# Patient Record
Sex: Male | Born: 1985 | Race: Black or African American | Hispanic: No | Marital: Single | Smoking: Never smoker
Health system: Southern US, Community
[De-identification: ages and names within clinical notes are randomized; demographics above are authoritative.]

## PROBLEM LIST (undated history)

## (undated) DIAGNOSIS — H548 Legal blindness, as defined in USA: Secondary | ICD-10-CM

## (undated) DIAGNOSIS — G4730 Sleep apnea, unspecified: Secondary | ICD-10-CM

## (undated) DIAGNOSIS — R519 Headache, unspecified: Secondary | ICD-10-CM

## (undated) HISTORY — PX: TONSILLECTOMY: SUR1361

## (undated) HISTORY — DX: Headache, unspecified: R51.9

## (undated) HISTORY — PX: EYE SURGERY: SHX253

---

## 2016-10-05 ENCOUNTER — Emergency Department
Admission: EM | Admit: 2016-10-05 | Discharge: 2016-10-05 | Disposition: A | Discharge status: Home / Self Care | Payer: Self-pay | Attending: Emergency Medicine | Admitting: Emergency Medicine

## 2016-10-05 ENCOUNTER — Encounter: Payer: Self-pay | Admitting: Emergency Medicine

## 2016-10-05 DIAGNOSIS — R0789 Other chest pain: Secondary | ICD-10-CM | POA: Insufficient documentation

## 2016-10-05 HISTORY — DX: Sleep apnea, unspecified: G47.30

## 2016-10-05 MED ORDER — MELOXICAM 15 MG PO TABS: 15.0000 mg | ORAL_TABLET | Freq: Every day | ORAL | 0 refills | Status: DC

## 2016-10-05 MED ORDER — PREDNISONE 50 MG PO TABS: 50.0000 mg | ORAL_TABLET | Freq: Every day | ORAL | 0 refills | Status: DC

## 2016-10-05 NOTE — ED Provider Notes (Signed)
[facility] Emergency Department Provider Note  ____________________________________________  Time seen: Approximately 3:10 PM  I have reviewed the triage vital signs and the nursing notes.   HISTORY  Chief Complaint Chest Pain    HPI Kyle Bradley is a 31 y.o. male who presents to emergency department complaining of anterior chest wall pain. Patient reports that symptoms have been intermittent over the past several months. Patient reports that he has a significant history of sleep apnea and is supposed to be on CPAP and states that his machine is not ordered at this time. She reports that he wakes up and feels "sore" to the restroom. Patient reports that he can take Motrin and symptoms go away. Patient reports her as a tight/sore sensation to the anterior chest wall. It is reproducible with movement and palpation. Patient denies any cardiac history. He denies any productive cough, lower extremity edema. Patient is currently symptom-free. He denies any headaches, visual changes, shortness of breath, abdominal pain, nausea or vomiting.   Past Medical History:  Diagnosis Date  . Sleep apnea     There are no active problems to display for this patient.   History reviewed. No pertinent surgical history.  Prior to Admission medications   Medication Sig Start Date End Date Taking? Authorizing Provider  meloxicam (MOBIC) 15 MG tablet Take 1 tablet (15 mg total) by mouth daily. 10/05/16   Delorise Royals Cuthriell, PA-C  predniSONE (DELTASONE) 50 MG tablet Take 1 tablet (50 mg total) by mouth daily with breakfast. 10/05/16   Delorise Royals Cuthriell, PA-C    Allergies Patient has no allergy information on record.  History reviewed. No pertinent family history.  Social History Social History  Substance Use Topics  . Smoking status: Never Smoker  . Smokeless tobacco: Never Used  . Alcohol use No     Review of Systems  Constitutional: No fever/chills Eyes: No  visual changes. No discharge ENT: No upper respiratory complaints. Cardiovascular:Positive chest pain. Respiratory: no cough. No SOB. Gastrointestinal: No abdominal pain.  No nausea, no vomiting.  No diarrhea.  No constipation. Musculoskeletal: Negative for musculoskeletal pain. Skin: Negative for rash, abrasions, lacerations, ecchymosis. Neurological: Negative for headaches, focal weakness or numbness. 10-point ROS otherwise negative.  ____________________________________________   PHYSICAL EXAM:  VITAL SIGNS: ED Triage Vitals  Enc Vitals Group     BP 10/05/16 1429 126/72     Pulse Rate 10/05/16 1429 91     Resp 10/05/16 1429 18     Temp 10/05/16 1429 99.3 F (37.4 C)     Temp Source 10/05/16 1429 Oral     SpO2 10/05/16 1429 98 %     Weight 10/05/16 1423 215 lb (97.5 kg)     Height 10/05/16 1423  (1.778 m)     Head Circumference --      Peak Flow --      Pain Score 10/05/16 1422 0     Pain Loc --      Pain Edu? --      Excl. in GC? --      Constitutional: Alert and oriented. Well appearing and in no acute distress. Eyes: Conjunctivae are normal. PERRL. EOMI. Head: Atraumatic. ENT:      Ears:       Nose: No congestion/rhinnorhea.      Mouth/Throat: Mucous membranes are moist.  Neck: No stridor.    Cardiovascular: Normal rate, regular rhythm. Normal S1 and S2. No murmurs rubs or gallops. Good peripheral circulation. Respiratory: Normal respiratory  effort without tachypnea or retractions. Lungs CTAB. Good air entry to the bases with no decreased or absent breath sounds. Musculoskeletal: Full range of motion to all extremities. No gross deformities appreciated. No visible deformity to chest wall but inspection. Patient is tender to palpation bilateral sternal border. Palpation reproduces pain. No palpable abnormality. Neurologic:  Normal speech and language. No gross focal neurologic deficits are appreciated.  Skin:  Skin is warm, dry and intact. No rash  noted. Psychiatric: Mood and affect are normal. Speech and behavior are normal. Patient exhibits appropriate insight and judgement.   ____________________________________________   LABS (all labs ordered are listed, but only abnormal results are displayed)  Labs Reviewed - No data to display ____________________________________________  EKG  EKG reveals normal sinus rhythm at a rate of 92 bpm. No ST elevations or depressions noted. Nonspecific T-wave changes in lateral leads. PR, QRS, QT interval within normal limits. No Q waves or delta waves. ____________________________________________  RADIOLOGY   No results found.  ____________________________________________    PROCEDURES  Procedure(s) performed:    Procedures    Medications - No data to display   ____________________________________________   INITIAL IMPRESSION / ASSESSMENT AND PLAN / ED COURSE  Pertinent labs & imaging results that were available during my care of the patient were reviewed by me and considered in my medical decision making (see chart for details).  Review of the [place] CSRS was performed in accordance of the [facility] prior to dispensing any controlled drugs.     Patient's diagnosis is consistent with chest wall pain. At this time, no concern for cardiac involvement as pain is reproducible with palpation and easily calmed with ibuprofen. Patient is tender to palpation along the intercostal margin of sternal border. EKG reveals no significant changes. At this time, no imaging or labs again necessary.. Patient will be discharged home with prescriptions for anti-inflammatory and limited steroids. Patient is informed to follow-up with primary care to become established as a patient as well as to clean his CPAP machine so he can uses at nighttime.. Patient is given ED precautions to return to the ED for any worsening or new symptoms.     ____________________________________________  FINAL CLINICAL  IMPRESSION(S) / ED DIAGNOSES  Final diagnoses:  Chest wall pain      NEW MEDICATIONS STARTED DURING THIS VISIT:  New Prescriptions   MELOXICAM (MOBIC) 15 MG TABLET    Take 1 tablet (15 mg total) by mouth daily.   PREDNISONE (DELTASONE) 50 MG TABLET    Take 1 tablet (50 mg total) by mouth daily with breakfast.        This chart was dictated using voice recognition software/Dragon. Despite best efforts to proofread, errors can occur which can change the meaning. Any change was purely unintentional.    Racheal Patches, PA-C 10/05/16 1536    Rockne Menghini, MD 10/05/16 1749

## 2016-10-05 NOTE — ED Triage Notes (Signed)
Pt states has chest pain in the am since he stopped using his sleep apnea machine. States he needs to clean it but wants to get a special machine to sterilize it. Has no family doctor since moving here 2 months ago. No current chest pain. States the pain only last a few minutes on awaking

## 2016-10-05 NOTE — ED Notes (Signed)

## 2016-11-05 ENCOUNTER — Encounter (HOSPITAL_COMMUNITY): Payer: Self-pay

## 2016-11-05 ENCOUNTER — Emergency Department (HOSPITAL_COMMUNITY): Payer: Self-pay

## 2016-11-05 ENCOUNTER — Emergency Department (HOSPITAL_COMMUNITY)
Admission: EM | Admit: 2016-11-05 | Discharge: 2016-11-05 | Disposition: A | Discharge status: Home / Self Care | Payer: Self-pay | Attending: Emergency Medicine | Admitting: Emergency Medicine

## 2016-11-05 DIAGNOSIS — Y999 Unspecified external cause status: Secondary | ICD-10-CM | POA: Insufficient documentation

## 2016-11-05 DIAGNOSIS — S61412A Laceration without foreign body of left hand, initial encounter: Secondary | ICD-10-CM

## 2016-11-05 DIAGNOSIS — Y939 Activity, unspecified: Secondary | ICD-10-CM | POA: Insufficient documentation

## 2016-11-05 DIAGNOSIS — S61215A Laceration without foreign body of left ring finger without damage to nail, initial encounter: Secondary | ICD-10-CM | POA: Insufficient documentation

## 2016-11-05 DIAGNOSIS — Y929 Unspecified place or not applicable: Secondary | ICD-10-CM | POA: Insufficient documentation

## 2016-11-05 HISTORY — DX: Legal blindness, as defined in USA: H54.8

## 2016-11-05 MED ORDER — BACITRACIN ZINC 500 UNIT/GM EX OINT
TOPICAL_OINTMENT | Freq: Two times a day (BID) | CUTANEOUS | Status: DC
  Administered 2016-11-05: 1 via TOPICAL

## 2016-11-05 MED ORDER — LIDOCAINE HCL 2 % IJ SOLN
20.0000 mL | Freq: Once | INTRAMUSCULAR | Status: AC
  Administered 2016-11-05: 400 mg
  Filled 2016-11-05: qty 20

## 2016-11-05 NOTE — ED Notes (Signed)
Bed: [facility] Expected date:  Expected time:  Means of arrival:  Comments: 

## 2016-11-05 NOTE — Discharge Instructions (Signed)
Please read attached information. If you experience any new or worsening signs or symptoms please return to the emergency room for evaluation. Please follow-up with your primary care provider or specialist as discussed.  °

## 2016-11-05 NOTE — ED Notes (Signed)
Bed: [facility]7 Expected date:  Expected time:  Means of arrival:  Comments: 

## 2016-11-05 NOTE — ED Notes (Signed)
Bed: [facility]4 Expected date:  Expected time:  Means of arrival:  Comments: 
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
## 2019-01-04 ENCOUNTER — Emergency Department (HOSPITAL_COMMUNITY)
Admission: EM | Admit: 2019-01-04 | Discharge: 2019-01-04 | Disposition: A | Discharge status: Home / Self Care | Payer: No Typology Code available for payment source | Attending: Emergency Medicine | Admitting: Emergency Medicine

## 2019-01-04 ENCOUNTER — Encounter (HOSPITAL_COMMUNITY): Payer: Self-pay

## 2019-01-04 ENCOUNTER — Other Ambulatory Visit: Payer: Self-pay

## 2019-01-04 ENCOUNTER — Emergency Department (HOSPITAL_COMMUNITY): Payer: No Typology Code available for payment source

## 2019-01-04 DIAGNOSIS — M791 Myalgia, unspecified site: Secondary | ICD-10-CM | POA: Insufficient documentation

## 2019-01-04 DIAGNOSIS — Y92410 Unspecified street and highway as the place of occurrence of the external cause: Secondary | ICD-10-CM | POA: Diagnosis not present

## 2019-01-04 DIAGNOSIS — M5441 Lumbago with sciatica, right side: Secondary | ICD-10-CM

## 2019-01-04 DIAGNOSIS — R51 Headache: Secondary | ICD-10-CM | POA: Insufficient documentation

## 2019-01-04 DIAGNOSIS — Z79899 Other long term (current) drug therapy: Secondary | ICD-10-CM | POA: Insufficient documentation

## 2019-01-04 DIAGNOSIS — M542 Cervicalgia: Secondary | ICD-10-CM | POA: Diagnosis not present

## 2019-01-04 DIAGNOSIS — R200 Anesthesia of skin: Secondary | ICD-10-CM | POA: Diagnosis not present

## 2019-01-04 DIAGNOSIS — M5442 Lumbago with sciatica, left side: Secondary | ICD-10-CM | POA: Diagnosis not present

## 2019-01-04 DIAGNOSIS — R0789 Other chest pain: Secondary | ICD-10-CM | POA: Insufficient documentation

## 2019-01-04 DIAGNOSIS — Y999 Unspecified external cause status: Secondary | ICD-10-CM | POA: Insufficient documentation

## 2019-01-04 DIAGNOSIS — R55 Syncope and collapse: Secondary | ICD-10-CM | POA: Insufficient documentation

## 2019-01-04 DIAGNOSIS — Y9389 Activity, other specified: Secondary | ICD-10-CM | POA: Diagnosis not present

## 2019-01-04 MED ORDER — HYDROCODONE-ACETAMINOPHEN 5-325 MG PO TABS: 1.0000 | ORAL_TABLET | Freq: Four times a day (QID) | ORAL | 0 refills | Status: DC | PRN

## 2019-01-04 MED ORDER — METHOCARBAMOL 500 MG PO TABS: 500.0000 mg | ORAL_TABLET | Freq: Two times a day (BID) | ORAL | 0 refills | Status: DC

## 2019-01-04 MED ORDER — IBUPROFEN 400 MG PO TABS
600.0000 mg | ORAL_TABLET | Freq: Once | ORAL | Status: AC
  Administered 2019-01-04: 600 mg via ORAL
  Filled 2019-01-04: qty 1
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
## 2019-01-04 NOTE — ED Notes (Signed)
Patient verbalizes understanding of discharge instructions. Opportunity for questioning and answering were provided.  patient discharged from ED.  

## 2019-01-10 ENCOUNTER — Emergency Department (HOSPITAL_COMMUNITY)
Admission: EM | Admit: 2019-01-10 | Discharge: 2019-01-10 | Disposition: A | Discharge status: Home / Self Care | Payer: No Typology Code available for payment source | Attending: Emergency Medicine | Admitting: Emergency Medicine

## 2019-01-10 ENCOUNTER — Encounter (HOSPITAL_COMMUNITY): Payer: Self-pay | Admitting: Emergency Medicine

## 2019-01-10 DIAGNOSIS — S39012D Strain of muscle, fascia and tendon of lower back, subsequent encounter: Secondary | ICD-10-CM

## 2019-01-10 DIAGNOSIS — M545 Low back pain: Secondary | ICD-10-CM | POA: Diagnosis present

## 2019-01-10 DIAGNOSIS — G44309 Post-traumatic headache, unspecified, not intractable: Secondary | ICD-10-CM | POA: Diagnosis not present

## 2019-01-10 DIAGNOSIS — M542 Cervicalgia: Secondary | ICD-10-CM | POA: Insufficient documentation

## 2019-01-10 LAB — I-STAT CHEM 8, ED
BUN: 8 mg/dL (ref 6–20)
Calcium, Ion: 1.14 mmol/L — ABNORMAL LOW (ref 1.15–1.40)
Chloride: 104 mmol/L (ref 98–111)
Creatinine, Ser: 1.1 mg/dL (ref 0.61–1.24)
Glucose, Bld: 83 mg/dL (ref 70–99)
HCT: 48 % (ref 39.0–52.0)
Hemoglobin: 16.3 g/dL (ref 13.0–17.0)
Potassium: 4.1 mmol/L (ref 3.5–5.1)
Sodium: 137 mmol/L (ref 135–145)
TCO2: 27 mmol/L (ref 22–32)

## 2019-01-10 MED ORDER — HYDROCODONE-ACETAMINOPHEN 5-325 MG PO TABS: 1.0000 | ORAL_TABLET | Freq: Once | ORAL | Status: DC

## 2019-01-10 MED ORDER — KETOROLAC TROMETHAMINE 30 MG/ML IJ SOLN
30.0000 mg | Freq: Once | INTRAMUSCULAR | Status: AC
  Administered 2019-01-10: 30 mg via INTRAVENOUS
  Filled 2019-01-10: qty 1

## 2019-01-10 MED ORDER — DEXAMETHASONE SODIUM PHOSPHATE 10 MG/ML IJ SOLN
10.0000 mg | Freq: Once | INTRAMUSCULAR | Status: AC
  Administered 2019-01-10: 10 mg via INTRAVENOUS
  Filled 2019-01-10: qty 1

## 2019-01-10 MED ORDER — SODIUM CHLORIDE 0.9 % IV BOLUS
1000.0000 mL | Freq: Once | INTRAVENOUS | Status: AC
  Administered 2019-01-10: 1000 mL via INTRAVENOUS

## 2019-01-10 NOTE — Discharge Instructions (Signed)
It was my pleasure taking care of you today!   Please call the primary care clinic listed to schedule a follow up appointment.   Return to ER for new or worsening symptoms, any additional concerns.
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
## 2019-01-13 ENCOUNTER — Ambulatory Visit (INDEPENDENT_AMBULATORY_CARE_PROVIDER_SITE_OTHER): Payer: Medicare Other | Admitting: Family Medicine

## 2019-01-13 ENCOUNTER — Encounter: Payer: Self-pay | Admitting: Family Medicine

## 2019-01-13 VITALS — Ht 70.0 in | Wt 235.0 lb

## 2019-01-13 DIAGNOSIS — M542 Cervicalgia: Secondary | ICD-10-CM | POA: Diagnosis not present

## 2019-01-13 DIAGNOSIS — M545 Low back pain, unspecified: Secondary | ICD-10-CM

## 2019-01-13 DIAGNOSIS — G44319 Acute post-traumatic headache, not intractable: Secondary | ICD-10-CM | POA: Diagnosis not present

## 2019-01-13 MED ORDER — CYCLOBENZAPRINE HCL 10 MG PO TABS: 10.0000 mg | ORAL_TABLET | Freq: Every day | ORAL | 0 refills | Status: DC

## 2019-01-13 MED ORDER — SUMATRIPTAN SUCCINATE 50 MG PO TABS: 50.0000 mg | ORAL_TABLET | ORAL | 0 refills | Status: DC | PRN

## 2019-01-13 MED ORDER — IBUPROFEN 800 MG PO TABS: 800.0000 mg | ORAL_TABLET | Freq: Three times a day (TID) | ORAL | 1 refills | Status: DC | PRN
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
## 2019-01-14 ENCOUNTER — Telehealth: Payer: Self-pay | Admitting: Family Medicine

## 2019-01-14 NOTE — Telephone Encounter (Signed)
Letter faxed and copy send to pt's address provided. Left detail message inform the pt.

## 2019-01-14 NOTE — Telephone Encounter (Signed)
Letter printed at my desk, waiting for the pt to call back with fax #.    Copied from [employer] [phone]. Topic: General - Other >> Jan 14, 2019  9:02 AM [name] wrote: Reason for CRM: Pt called to see if work letter can be faxed to his employer. Pt will be calling back with the fax number

## 2019-01-14 NOTE — Telephone Encounter (Signed)
Pt called back to provide fax number: [phone]  Pt states this needs to be faxed today. Please call and leave a VM when this is completed for his records. Requesting this to be mailed to him as well:  [address] [place] [address]

## 2019-01-20 ENCOUNTER — Inpatient Hospital Stay: Payer: Medicare Other

## 2019-01-22 ENCOUNTER — Ambulatory Visit: Payer: Medicare Other | Attending: Family Medicine | Admitting: Physical Therapy

## 2019-01-22 ENCOUNTER — Other Ambulatory Visit: Payer: Self-pay

## 2019-01-22 ENCOUNTER — Encounter: Payer: Self-pay | Admitting: Physical Therapy

## 2019-01-22 DIAGNOSIS — M545 Low back pain, unspecified: Secondary | ICD-10-CM

## 2019-01-22 DIAGNOSIS — G44319 Acute post-traumatic headache, not intractable: Secondary | ICD-10-CM
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
## 2019-01-27 ENCOUNTER — Ambulatory Visit: Payer: Medicare Other | Admitting: Physical Therapy

## 2019-01-28 ENCOUNTER — Encounter: Payer: Self-pay | Admitting: Physical Therapy

## 2019-01-28 ENCOUNTER — Other Ambulatory Visit: Payer: Self-pay

## 2019-01-28 ENCOUNTER — Ambulatory Visit: Payer: Medicare Other | Attending: Family Medicine | Admitting: Physical Therapy

## 2019-01-28 DIAGNOSIS — M545 Low back pain, unspecified: Secondary | ICD-10-CM

## 2019-01-28 DIAGNOSIS — G44319 Acute post-traumatic headache, not intractable: Secondary | ICD-10-CM | POA: Insufficient documentation

## 2019-01-28 NOTE — Therapy (Signed)
[place], [place], [address] Phone: [phone]   Fax:  [phone]  Physical Therapy Treatment  Patient Details  Name: Kyle Bradley MRN: [id number] Date of Birth: 06/09/1986 Referring Provider (PT): Letta Median, DO   Encounter Date: 01/28/2019  PT End of Session - 01/28/19 1553    Visit Number  2    Number of Visits  12    Date for PT Re-Evaluation  03/05/19    Authorization Type  Medicare, progress note by visit 10 and KX at visit 15    PT Start Time  1414    PT Stop Time  1512    PT Time Calculation (min)  58 min    Activity Tolerance  Patient limited by pain    Behavior During Therapy  [facility] for tasks assessed/performed       Past Medical History:  Diagnosis Date  . Legally blind   . Sleep apnea     Past Surgical History:  Procedure Laterality Date  . EYE SURGERY    . TONSILLECTOMY      There were no vitals filed for this visit.  Subjective Assessment - 01/28/19 1443    Subjective  Pain a 6/10 this PM for both neck and low back. Pt. reports he continues to have exacerbation of his symptoms with work duties for bending and lifting required. He continues also with intermittent "Charlie horse" pain and cramping in his lower legs.    Pertinent History  legally blind    Limitations  Sitting;House hold activities;Lifting;Standing;Walking    Diagnostic tests  CXR, CT scan    Currently in Pain?  Yes    Pain Score  6     Pain Location  Back    Pain Orientation  Right;Left;Lower    Pain Descriptors / Indicators  Sharp    Pain Type  Acute pain    Pain Radiating Towards  posterior legs, notes "charlie horses" in calves    Pain Onset  1 to 4 weeks ago    Pain Frequency  Constant    Aggravating Factors   bending, prolonged standing, bending and lifting    Pain Relieving Factors  lying down    Effect of Pain on Daily Activities  impacts ability for work duties, positional tolerance for ADLs     Multiple Pain Sites  Yes    Pain Location  Head    Pain Orientation  --   "whole head"   Pain Descriptors / Indicators  Aching    Pain Type  Acute pain    Pain Radiating Towards  neck and bilateral upper trapezius    Pain Onset  1 to 4 weeks ago    Pain Frequency  Constant    Aggravating Factors   no specific aggs noted    Pain Relieving Factors  cold, medication                       [facility] Adult PT Treatment/Exercise - 01/28/19 0001      Neck Exercises: Supine   Neck Retraction  15 reps    Neck Retraction Limitations  with gentle anual assistance      Lumbar Exercises: Stretches   Single Knee to Chest Stretch  Right;Left;3 reps;10 seconds    Pelvic Tilt  10 reps      Lumbar Exercises: Aerobic   Nustep  L3 x 5 min UE/LE      Modalities   Modalities  Electrical  Stimulation;Moist Heat      Moist Heat Therapy   Number Minutes Moist Heat  10 Minutes    Moist Heat Location  Cervical;Lumbar Spine      Electrical Stimulation   Electrical Stimulation Location  cervical and lumbar in supine    Electrical Stimulation Action  IFC    Electrical Stimulation Parameters  10 minutes    Electrical Stimulation Goals  Tone;Pain      Manual Therapy   Manual Therapy  Joint mobilization;Soft tissue mobilization;Myofascial release;Manual Traction    Joint Mobilization  supine grade V thoracic manipulation, long axis distraction hip mobilizations grade I-IV in supine with hip in slight IR, neutral position otherwise    Soft tissue mobilization  Cervical paraspinals, upper trapezius and levator region bilaterally    Myofascial Release  suboccipital release, upper trap and levator myofascial release incorporated with stretches    Manual Traction  gentle cervical manual traction      Neck Exercises: Stretches   Upper Trapezius Stretch  Right;Left;3 reps;20 seconds   supine manual stretches   Levator Stretch  Right;Left;3 reps;20 seconds   supine manual stretches   Corner  Stretch  3 reps;20 seconds             PT Education - 01/28/19 1551    Education Details  treatment logic for joint/spinal mobilization, POC, exercises    Person(s) Educated  Patient    Methods  Explanation    Comprehension  Verbalized understanding;Returned demonstration;Verbal cues required;Tactile cues required       PT Short Term Goals - 01/22/19 0921      PT SHORT TERM GOAL #1   Title  Independent with HEP    Baseline  needs HEP    Time  3    Period  Weeks    Status  New      PT SHORT TERM GOAL #2   Title  Increase bilat. shoulder flexion and abduction AROM 20 deg or greater to improve ability for reaching for dressing, chores at home such as reaching into overhead cabinets    Baseline  80 deg flexion and 70 deg abduction bilat.    Time  3    Period  Weeks      PT SHORT TERM GOAL #3   Title  Increase trunk flexion AROM 20 deg or greater to improve ability to bend forward to donn shoes    Baseline  20 deg    Time  3    Period  Weeks    Status  New        PT Long Term Goals - 01/22/19 [id number]      PT LONG TERM GOAL #1   Title  Improve FOTO outcome measure score to 34% or less impairment    Baseline  41% limited    Time  6    Period  Weeks    Status  New    Target Date  03/05/19      PT LONG TERM GOAL #2   Title  Bilat. UE/LE strength grossly 5/5 to improve ability for lifting activities for work    Baseline  4/5    Time  6    Period  Weeks    Status  New    Target Date  03/05/19      PT LONG TERM GOAL #3   Title  Increase cervical AROM for right rotation at least 20 deg to improve ability to turn head while driving    Baseline  38 deg    Time  6    Period  Weeks    Status  New    Target Date  03/05/19      PT LONG TERM GOAL #4   Title  Tolerate sitting and standing at least 30 min for improved positional tolerance for work duties, driving, eating meals with pain <4/10 at worst    Baseline  10/10    Time  6    Period  Weeks    Status  New     Target Date  03/05/19      PT LONG TERM GOAL #5   Title  Trunk and bilat. shoulder AROM grossly WFL for dressing, bathing    Baseline  see objective    Time  6    Period  Weeks    Status  New    Target Date  03/05/19            Plan - 01/28/19 1553    Clinical Impression Statement  Mild decrease in pain from baseline but still with diffuse muscle pain and spasm affecting lumbar and cervical region with associated headaches. Given symptom etiology/mechanism of injury expect progress will be gradual. Pt. would benefit from continued PT for further progress to help relieve pain and address associated functional limitations.    Personal Factors and Comorbidities  Comorbidity 1    Comorbidities  legally blind    Examination-Activity Limitations  Bathing;Dressing;Sit;Sleep;Hygiene/Grooming;Bed Mobility;Lift;Bend;Squat;Stand;Stairs;Reach Overhead;Carry;Locomotion Level    Examination-Participation Restrictions  Laundry;Shop;Cleaning;Community Activity;Yard Work;Meal Prep;Driving    Stability/Clinical Decision Making  Evolving/Moderate complexity    Clinical Decision Making  Moderate    Rehab Potential  Good    PT Frequency  2x / week    PT Duration  6 weeks    PT Treatment/Interventions  ADLs/Self Care Home Management;Ultrasound;Cryotherapy;Electrical Stimulation;Moist Heat;Traction;Therapeutic activities;Functional mobility training;Therapeutic exercise;Patient/family education;Neuromuscular re-education;Manual techniques;Passive range of motion;Taping;Spinal Manipulations;Dry needling    PT Next Visit Plan  plan more focus lumbar region next visit, continue gentle manual techniques, LAD for hips, lumbar STM, progress ROM and stretches as tolerated, modalities prn    PT Home Exercise Plan  cervical retractions, cervical rotation AROM, gentle upper trap stretch, scapular retractions, pelvic tilts, LTR, SKTC assisted with strap    Consulted and Agree with Plan of Care  Patient        Patient will benefit from skilled therapeutic intervention in order to improve the following deficits and impairments:  Pain, Postural dysfunction, Impaired UE functional use, Impaired flexibility, Decreased strength, Decreased activity tolerance, Decreased range of motion, Difficulty walking, Decreased mobility, Impaired tone, Increased muscle spasms, Hypomobility  Visit Diagnosis: 1. Acute bilateral low back pain without sciatica   2. Acute post-traumatic headache, not intractable        Problem List There are no active problems to display for this patient.   Lazarus Gowdahristopher Jaylyne Breese, PT, DPT 01/28/19 4:03 PM  [facility] [address] [place], [place], [address] Phone: [phone]   Fax:  [phone]  Name: Floy SabinaMarkel Groom MRN: [id number] Date of Birth: 10/21/1985

## 2019-01-29 ENCOUNTER — Ambulatory Visit: Payer: Medicare Other | Admitting: Physical Therapy

## 2019-01-29 ENCOUNTER — Other Ambulatory Visit: Payer: Self-pay | Admitting: Family Medicine

## 2019-01-29 DIAGNOSIS — M545 Low back pain, unspecified: Secondary | ICD-10-CM

## 2019-02-01 NOTE — Telephone Encounter (Signed)
Dr. Loletha Grayer please advise last OV 7/22 last refill 7/22 #15 0 refills

## 2019-02-02 ENCOUNTER — Other Ambulatory Visit: Payer: Self-pay

## 2019-02-02 ENCOUNTER — Encounter: Payer: Self-pay | Admitting: Physical Therapy

## 2019-02-02 ENCOUNTER — Ambulatory Visit: Payer: Medicare Other | Admitting: Physical Therapy

## 2019-02-02 DIAGNOSIS — M545 Low back pain, unspecified: Secondary | ICD-10-CM

## 2019-02-02 DIAGNOSIS — G44319 Acute post-traumatic headache, not intractable: Secondary | ICD-10-CM

## 2019-02-02 NOTE — Therapy (Signed)
[facility] [address] [place], [place], [address] Phone: [phone]   Fax:  [phone]  Physical Therapy Treatment  Patient Details  Name: Kyle Bradley MRN: [id number] Date of Birth: 01/24/1986 Referring Provider (PT): Luana ShuMary Cirigliano, DO   Encounter Date: 02/02/2019  PT End of Session - 02/02/19 1218    Visit Number  3    Number of Visits  12    Date for PT Re-Evaluation  03/05/19    Authorization Type  Medicare, progress note by visit 10 and KX at visit 15    PT Start Time  1216    PT Stop Time  1258    PT Time Calculation (min)  42 min    Activity Tolerance  Patient limited by pain    Behavior During Therapy  [facility] for tasks assessed/performed       Past Medical History:  Diagnosis Date  . Legally blind   . Sleep apnea     Past Surgical History:  Procedure Laterality Date  . EYE SURGERY    . TONSILLECTOMY      There were no vitals filed for this visit.  Subjective Assessment - 02/02/19 1217    Subjective  Same, neck and back about 5/10-6/10.  Continues to work at [employer].  Doubtful this will work with me going right to work each time.    Currently in Pain?  Yes    Pain Score  6     Pain Location  Back    Pain Orientation  Lower    Pain Descriptors / Indicators  Sore    Pain Type  Acute pain    Pain Onset  1 to 4 weeks ago    Pain Frequency  Constant    Aggravating Factors   bend, stand, lift, squat    Pain Relieving Factors  lying down    Pain Score  6    Pain Location  Neck    Pain Orientation  Posterior    Pain Descriptors / Indicators  Aching    Pain Type  Acute pain    Pain Onset  1 to 4 weeks ago    Pain Frequency  Constant          [facility] Adult PT Treatment/Exercise - 02/02/19 0001      Posture/Postural Control   Posture Comments  Sitting with good posture: chin tuck, scap squeeze , x 10 max cueing needed.        Therapeutic Activites    Therapeutic Activities  Lifting;Work  Counselling psychologistimulation;Other Therapeutic Activities    Lifting   body mechanics     Work Designer, multimediaimulation  lifting     Other Therapeutic Activities  hip hinge , squat       Neck Exercises: Theraband   Horizontal ABduction  10 reps;Green    Horizontal ABduction Limitations  mod cues     Other Theraband Exercises  narrow grip overhead pull green x 10 with chin tuck       Neck Exercises: Seated   Cervical Rotation  Both;5 reps      Lumbar Exercises: Stretches   Single Knee to Chest Stretch  Right;Left;3 reps;10 seconds    Lower Trunk Rotation  10 seconds    Lower Trunk Rotation Limitations  x 10     Pelvic Tilt  10 reps    Pelvic Tilt Limitations  mod cues       Lumbar Exercises: Standing   Lifting  From 12";10 reps  Lifting Weights (lbs)  25    Lifting Limitations  kettle bell, squat and dead lift (unable to do safely), used wall for dead lift    Wall Slides  10 reps    Wall Slides Limitations  pain, cues     Other Standing Lumbar Exercises  hold mini squat with green band pulls x 5 (unable to finish 10)     Other Standing Lumbar Exercises  seated hip hinge with yardstick, unable to maintain head neck and shoulder position, rounds back      Neck Exercises: Stretches   Upper Trapezius Stretch  Right;Left;3 reps;30 seconds               PT Short Term Goals - 02/02/19 1242      PT SHORT TERM GOAL #1   Title  Independent with HEP    Baseline  needs mod to max cues, reports not doing at all, too busy    Status  On-going      PT SHORT TERM GOAL #2   Title  Increase bilat. shoulder flexion and abduction AROM 20 deg or greater to improve ability for reaching for dressing, chores at home such as reaching into overhead cabinets    Status  Unable to assess      PT SHORT TERM GOAL #3   Title  Increase trunk flexion AROM 20 deg or greater to improve ability to bend forward to donn shoes    Status  Unable to assess        PT Long Term Goals - 02/02/19 1243      PT LONG TERM GOAL #1    Title  Improve FOTO outcome measure score to 34% or less impairment    Status  Unable to assess      PT LONG TERM GOAL #2   Title  Bilat. UE/LE strength grossly 5/5 to improve ability for lifting activities for work    Status  On-going      PT [place] #3   Title  Increase cervical AROM for right rotation at least 20 deg to improve ability to turn head while driving    Status  On-going      PT LONG TERM GOAL #4   Title  Tolerate sitting and standing at least 30 min for improved positional tolerance for work duties, driving, eating meals with pain <4/10 at worst    Status  On-going      PT LONG TERM GOAL #5   Title  Trunk and bilat. shoulder AROM grossly WFL for dressing, bathing    Status  On-going            Plan - 02/02/19 1243    Clinical Impression Statement  Patient with fatigue in LEs with practicing lifting today.  He does not use good body mechanics during light lifting here in the clinic. Hip tightness limits form with squatting and hip hinging. Unable to complete standing band exercises due to pain .  Declined heat today.    PT Treatment/Interventions  ADLs/Self Care Home Management;Ultrasound;Cryotherapy;Electrical Stimulation;Moist Heat;Traction;Therapeutic activities;Functional mobility training;Therapeutic exercise;Patient/family education;Neuromuscular re-education;Manual techniques;Passive range of motion;Taping;Spinal Manipulations;Dry needling    PT Next Visit Plan  plan more focus lumbar region next visit, continue gentle manual techniques, LAD for hips, lumbar STM, progress ROM and stretches as tolerated, modalities prn    PT Home Exercise Plan  cervical retractions, cervical rotation AROM, gentle upper trap stretch, scapular retractions, pelvic tilts, LTR, SKTC assisted with strap  Consulted and Agree with Plan of Care  Patient       Patient will benefit from skilled therapeutic intervention in order to improve the following deficits and impairments:   Pain, Postural dysfunction, Impaired UE functional use, Impaired flexibility, Decreased strength, Decreased activity tolerance, Decreased range of motion, Difficulty walking, Decreased mobility, Impaired tone, Increased muscle spasms, Hypomobility  Visit Diagnosis: 1. Acute bilateral low back pain without sciatica   2. Acute post-traumatic headache, not intractable        Problem List There are no active problems to display for this patient.   Dorinda Stehr 02/02/2019, 1:51 PM  [facility] [address] [place], [place], [address] Phone: [phone]   Fax:  [phone]  Name: Kyle Bradley MRN: [id number] Date of Birth: 12/19/1985  Karie MainlandJennifer Nakhi Choi, PT 02/02/19 1:51 PM Phone: [phone] Fax: [phone]

## 2019-02-05 ENCOUNTER — Encounter

## 2019-02-08 ENCOUNTER — Ambulatory Visit: Payer: Medicare Other | Admitting: Physical Therapy

## 2019-02-10 ENCOUNTER — Ambulatory Visit: Payer: Medicare Other | Admitting: Physical Therapy

## 2019-02-10 ENCOUNTER — Telehealth: Payer: Self-pay | Admitting: Physical Therapy

## 2019-02-11 NOTE — Telephone Encounter (Signed)
Called patient to check on status regarding missed visit-he reports had already called the clinic to cancel and confirmed next appointment date.

## 2019-02-15 ENCOUNTER — Ambulatory Visit: Payer: Medicare Other | Admitting: Physical Therapy

## 2019-02-17 ENCOUNTER — Telehealth: Payer: Self-pay

## 2019-02-17 ENCOUNTER — Ambulatory Visit: Payer: Medicare Other | Admitting: Physical Therapy

## 2019-02-17 ENCOUNTER — Telehealth: Payer: Self-pay | Admitting: Physical Therapy

## 2019-02-17 NOTE — Telephone Encounter (Signed)
Questions for Screening COVID-19  Symptom onset:n/a  Travel or Contacts: no  During this illness, did/does the patient experience any of the following symptoms? Fever >100.4F []  Yes [x]  No []  Unknown Subjective fever (felt feverish) []  Yes [x]  No []  Unknown Chills []  Yes [x]  No []  Unknown Muscle aches (myalgia) []  Yes [x]  No []  Unknown Runny nose (rhinorrhea) []  Yes [x]  No []  Unknown Sore throat []  Yes [x]  No []  Unknown Cough (new onset or worsening of chronic cough) []  Yes [x]  No []  Unknown Shortness of breath (dyspnea) []  Yes [x]  No []  Unknown Nausea or vomiting []  Yes [x]  No []  Unknown Headache []  Yes [x]  No []  Unknown Abdominal pain  []  Yes [x]  No []  Unknown Diarrhea (?3 loose/looser than normal stools/24hr period) []  Yes [x]  No []  Unknown Other, specify:  Patient risk factors: Smoker? []  Current []  Former []  Never If male, currently pregnant? []  Yes []  No  There are no active problems to display for this patient.   Plan:  []  High risk for COVID-19 with red flags go to ED (with CP, SOB, weak/lightheaded, or fever > 101.5). Call ahead.  []  High risk for COVID-19 but stable. Inform provider and coordinate time for WEBEX visit.   []  No red flags but URI signs or symptoms okay for WEBEX visit.  

## 2019-02-17 NOTE — Telephone Encounter (Signed)
Called patient to check on status regarding no show for 10:15 appointment today-left message informing due to attendance policy for now will keep next scheduled visit but any further visits have been cancelled and moving forward per attendance policy scheduling would be limited to 1 visit at at time. Requested call back to inform clinic if wishing to continue therapy at this facility.

## 2019-02-18 ENCOUNTER — Other Ambulatory Visit: Payer: Self-pay

## 2019-02-18 ENCOUNTER — Ambulatory Visit (INDEPENDENT_AMBULATORY_CARE_PROVIDER_SITE_OTHER): Payer: Medicare Other | Admitting: Family Medicine

## 2019-02-18 ENCOUNTER — Encounter: Payer: Self-pay | Admitting: Family Medicine

## 2019-02-18 DIAGNOSIS — M549 Dorsalgia, unspecified: Secondary | ICD-10-CM | POA: Diagnosis not present

## 2019-02-18 DIAGNOSIS — M542 Cervicalgia: Secondary | ICD-10-CM | POA: Diagnosis not present

## 2019-02-18 DIAGNOSIS — G44309 Post-traumatic headache, unspecified, not intractable: Secondary | ICD-10-CM

## 2019-02-18 MED ORDER — TOPIRAMATE 50 MG PO TABS: ORAL_TABLET | ORAL | 1 refills | Status: DC
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
## 2019-02-22 ENCOUNTER — Ambulatory Visit: Payer: Medicare Other | Admitting: Physical Therapy

## 2019-02-22 ENCOUNTER — Telehealth: Payer: Self-pay
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
## 2019-02-23 NOTE — Telephone Encounter (Signed)
Note done and sent to pt via mychart. Please print and mail him a copy as well. Thank you

## 2019-02-23 NOTE — Telephone Encounter (Signed)
Pt aware of letter being ready on mychart and that I will send in mail

## 2019-02-24 ENCOUNTER — Ambulatory Visit: Payer: Medicare Other | Admitting: Physical Therapy

## 2019-03-03 ENCOUNTER — Ambulatory Visit: Payer: Medicare Other | Admitting: Physical Therapy

## 2019-03-03 ENCOUNTER — Ambulatory Visit: Payer: Self-pay | Admitting: *Deleted

## 2019-03-03 DIAGNOSIS — R519 Headache, unspecified: Secondary | ICD-10-CM

## 2019-03-03 NOTE — Telephone Encounter (Signed)
Summary: Headache/ medication problem    topiramate (TOPAMAX) 50 MG tablet [[id number]] pt called and stated that he has tried medication and it is still not working and would like to have some advise. Pt states that he is having a headache everyday.       Head hurts, has dizziness and vomits some times. Has been taking Topomax and is not helping his pain.   He is requesting a referral for a neurologist as was mentioned to him. Did not want to try to schedule another appointment. He has one already scheduled for 09/16. He would like for his pcp to call him back regarding a referral to see a neurologist. Routing to LB at [facility] for review and recommendation.  Reason for Disposition . [1] Caller has NON-URGENT medication question about med that PCP prescribed AND [2] triager unable to answer question  Answer Assessment - Initial Assessment Questions 1.   NAME of MEDICATION: "What medicine are you calling about?"     topomax 2.   QUESTION: "What is your question?"     Would like a referral to see a neurologist for his headaches 3.   PRESCRIBING HCP: "Who prescribed it?" Reason: if prescribed by specialist, call should be referred to that group.     Dr.Cirigliano 4. SYMPTOMS: "Do you have any symptoms?"     Headaches, sometimes dizziness and nausea 5. SEVERITY: If symptoms are present, ask "Are they mild, moderate or severe?"     Moderate to severe 6.  PREGNANCY:  "Is there any chance that you are pregnant?" "When was your last menstrual period?"     n/a  Protocols used: MEDICATION QUESTION CALL-A-AH

## 2019-03-04 NOTE — Telephone Encounter (Signed)
Okay to place referral to neurology and have pt follow up with Dr. Loletha Grayer on the 16th?

## 2019-03-05 ENCOUNTER — Ambulatory Visit: Payer: Medicare Other | Admitting: Physical Therapy

## 2019-03-05 NOTE — Telephone Encounter (Signed)
I called and spoke with pt. He is aware that referral has been placed & that he will be contacted to set up an appt.

## 2019-03-05 NOTE — Addendum Note (Signed)
Addended by: Rodrigo Ran on: 03/05/2019 10:28 AM   Modules accepted: Orders

## 2019-03-05 NOTE — Telephone Encounter (Signed)
I think that would be ok

## 2019-03-08 ENCOUNTER — Encounter: Payer: Medicare Other | Admitting: Physical Therapy

## 2019-03-10 ENCOUNTER — Telehealth (INDEPENDENT_AMBULATORY_CARE_PROVIDER_SITE_OTHER): Payer: Medicare Other | Admitting: Family Medicine

## 2019-03-10 ENCOUNTER — Other Ambulatory Visit: Payer: Self-pay

## 2019-03-10 ENCOUNTER — Encounter: Payer: Self-pay | Admitting: Family Medicine

## 2019-03-10 ENCOUNTER — Ambulatory Visit (INDEPENDENT_AMBULATORY_CARE_PROVIDER_SITE_OTHER): Payer: Medicare Other | Admitting: Neurology

## 2019-03-10 ENCOUNTER — Encounter: Payer: Self-pay | Admitting: Neurology

## 2019-03-10 ENCOUNTER — Encounter: Payer: Medicare Other | Admitting: Physical Therapy

## 2019-03-10 ENCOUNTER — Other Ambulatory Visit: Payer: Self-pay | Admitting: *Deleted

## 2019-03-10 VITALS — BP 127/74 | HR 75 | Temp 97.5°F | Ht 70.0 in | Wt 214.0 lb

## 2019-03-10 VITALS — Ht 70.0 in

## 2019-03-10 DIAGNOSIS — M549 Dorsalgia, unspecified: Secondary | ICD-10-CM

## 2019-03-10 DIAGNOSIS — R519 Headache, unspecified: Secondary | ICD-10-CM | POA: Insufficient documentation

## 2019-03-10 DIAGNOSIS — R51 Headache: Secondary | ICD-10-CM | POA: Diagnosis not present

## 2019-03-10 MED ORDER — PROPRANOLOL HCL 20 MG PO TABS: 20.0000 mg | ORAL_TABLET | Freq: Two times a day (BID) | ORAL | 11 refills | Status: AC

## 2019-03-10 MED ORDER — TIZANIDINE HCL 4 MG PO CAPS: 4.0000 mg | ORAL_CAPSULE | Freq: Three times a day (TID) | ORAL | 6 refills | Status: DC | PRN

## 2019-03-10 MED ORDER — ONDANSETRON 4 MG PO TBDP: 4.0000 mg | ORAL_TABLET | Freq: Three times a day (TID) | ORAL | 6 refills | Status: AC | PRN

## 2019-03-10 MED ORDER — TIZANIDINE HCL 4 MG PO TABS: 4.0000 mg | ORAL_TABLET | Freq: Three times a day (TID) | ORAL | 5 refills | Status: DC | PRN

## 2019-03-10 MED ORDER — RIZATRIPTAN BENZOATE 10 MG PO TBDP: 10.0000 mg | ORAL_TABLET | ORAL | 6 refills | Status: DC | PRN

## 2019-03-10 MED ORDER — NORTRIPTYLINE HCL 25 MG PO CAPS: 50.0000 mg | ORAL_CAPSULE | Freq: Every day | ORAL | 11 refills | Status: DC

## 2019-03-10 MED ORDER — PREDNISONE 20 MG PO TABS: ORAL_TABLET | ORAL | 1 refills | Status: DC
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
## 2019-03-10 NOTE — Patient Instructions (Addendum)
Migraine preventive medications:  Propanolol 20 mg twice a day Nortriptyline 25 mg 2 tablets at midnight  Abortive treatment:  Maxalt 10 mg as needed May mix with Aleve 1 to 2 tablets   Zofran 4 mg as needed for nausea, tizanidine 4 mg as needed for muscle relaxant
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
## 2019-03-16 ENCOUNTER — Other Ambulatory Visit: Payer: Self-pay | Admitting: Family Medicine

## 2019-03-16 DIAGNOSIS — G44309 Post-traumatic headache, unspecified, not intractable: Secondary | ICD-10-CM

## 2019-04-03 ENCOUNTER — Other Ambulatory Visit: Payer: Self-pay | Admitting: Neurology
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
## 2019-04-13 ENCOUNTER — Ambulatory Visit: Payer: Medicare Other | Admitting: Neurology

## 2019-05-03 ENCOUNTER — Ambulatory Visit (INDEPENDENT_AMBULATORY_CARE_PROVIDER_SITE_OTHER): Payer: Medicare Other | Admitting: Nurse Practitioner

## 2019-05-03 ENCOUNTER — Ambulatory Visit: Payer: Self-pay

## 2019-05-03 ENCOUNTER — Encounter: Payer: Self-pay | Admitting: Nurse Practitioner

## 2019-05-03 VITALS — Ht 70.0 in

## 2019-05-03 DIAGNOSIS — G43011 Migraine without aura, intractable, with status migrainosus: Secondary | ICD-10-CM

## 2019-05-03 DIAGNOSIS — H5440 Blindness, one eye, unspecified eye: Secondary | ICD-10-CM | POA: Insufficient documentation

## 2019-05-03 MED ORDER — SUMATRIPTAN SUCCINATE 50 MG PO TABS: 50.0000 mg | ORAL_TABLET | ORAL | 0 refills | Status: AC | PRN
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
## 2019-05-03 NOTE — Telephone Encounter (Signed)
Patient called and says he's been having a headache since Saturday. He says he woke up on Sunday with the headache and went to the ED at the hospital in [place], because he was already there for the weekend. He says he received IV medication and was feeling a little better, but today he woke up with the headache and had to call out of work. He says earlier today he was a little dizzy, had blurry vision and was nauseated, but now all those symptoms are gone and he has a headache at a 6 on the pain scale. I asked about any other symptoms, he denies. I advised he will need an appointment. He says I already have one for Wednesday, but I will need a note for being out of work today and I will need to go to the ED because the headache is not any better. He says he took his migraine medication, but it's not helping. I asked him to hold while I call the office to see if he can be seen earlier than Wednesday. I called and spoke to [place] who says there are appointments today. I advised the patient, he verbalized understanding, the call was connected successfully.  Reason for Disposition . [1] MODERATE headache (e.g., interferes with normal activities) AND [2] present > 24 hours AND [3] unexplained  (Exceptions: analgesics not tried, typical migraine, or headache part of viral illness)  Answer Assessment - Initial Assessment Questions 1. LOCATION: "Where does it hurt?"     Between eyes, all through the head 2. ONSET: "When did the headache start?" (Minutes, hours or days)      Saturday 3. PATTERN: "Does the pain come and go, or has it been constant since it started?"     Constant 4. SEVERITY: "How bad is the pain?" and "What does it keep you from doing?"  (e.g., Scale 1-10; mild, moderate, or severe)   - MILD (1-3): doesn't interfere with normal activities    - MODERATE (4-7): interferes with normal activities or awakens from sleep    - SEVERE (8-10): excruciating pain, unable to do any normal activities         6 5. RECURRENT SYMPTOM: "Have you ever had headaches before?" If so, ask: "When was the last time?" and "What happened that time?"      Yes, last time was Tuesday or Wednesday last week, just took Motrin 800 mg and it went away. 6. CAUSE: "What do you think is causing the headache?"     No idea 7. MIGRAINE: "Have you been diagnosed with migraine headaches?" If so, ask: "Is this headache similar?"      Yes, it's similar to migraine 8. HEAD INJURY: "Has there been any recent injury to the head?"      No 9. OTHER SYMPTOMS: "Do you have any other symptoms?" (fever, stiff neck, eye pain, sore throat, cold symptoms)     Blurry vision, little pain between eyes, nausea earlier and dizzy earlier 10. PREGNANCY: "Is there any chance you are pregnant?" "When was your last menstrual period?"       No  Protocols used: HEADACHE-A-AH

## 2019-05-03 NOTE — Patient Instructions (Addendum)
Please contact Dr. Krista Blue (neurologist) for an appointment ASAP. Take imitrex as needed for acute headache. Continue zofran as needed for nausea. Resume propanolol and nortriptyline, daily use to help decrease headache frequency and severity. Work excuse was provided for today.  Recurrent Migraine Headache A migraine headache is very bad, throbbing pain that is usually on one side of your head. Recurrent migraines keep coming back (recurring). Talk with your doctor about what things may bring on (trigger) your migraine headaches. Follow these instructions at home: Medicines  Take over-the-counter and prescription medicines only as told by your doctor.  Do not drive or use heavy machinery while taking prescription pain medicine. Lifestyle  Do not use any products that contain nicotine or tobacco, such as cigarettes and e-cigarettes. If you need help quitting, ask your doctor.  Limit alcohol intake to no more than 1 drink a day for nonpregnant women and 2 drinks a day for men. One drink equals 12 oz of beer, 5 oz of wine, or 1 oz of hard liquor.  Get 7-9 hours of sleep each night.  Lessen any stress in your life. Ask your doctor about ways to lower your stress.  Stay at a healthy weight. Talk with your doctor if you need help losing weight.  Get regular exercise. General instructions   Keep a journal to find out if certain things bring on migraine headaches. For example, write down: ? What you eat and drink. ? How much sleep you get. ? Any change to your diet or medicines.  Lie down in a dark, quiet room when you have a migraine.  Try placing a cool towel over your head when you have a migraine.  Keep lights dim if bright lights bother you or make your migraines worse.  Keep all follow-up visits as told by your doctor. This is important. Contact a doctor if:  Medicine does not help your migraines.  Your pain keeps coming back.  You have a fever.  You have weight loss  without trying. Get help right away if:  Your migraine becomes really bad and medicine does not help.  You have a stiff neck.  You have trouble seeing.  Your muscles are weak or you lose control of your muscles.  You lose your balance or have trouble walking.  You feel like you will pass out (faint) or you pass out.  You have really bad symptoms that are different than your first symptoms.  You start having sudden, very bad headaches that last for one second or less, like a thunderclap. Summary  A migraine headache is very bad, throbbing pain that is usually on one side of your head.  Talk with your doctor about what things may bring on (trigger) your migraine headaches.  Take over-the-counter and prescription medicines only as told by your doctor.  Lie down in a dark, quiet room when you have a migraine.  Keep a journal about what you eat and drink, how much sleep you get, and any changes to your medicines. This can help you find out if certain things make you have migraine headaches. This information is not intended to replace advice given to you by your health care provider. Make sure you discuss any questions you have with your health care provider. Document Released: 03/19/2008 Document Revised: 06/13/2017 Document Reviewed: 05/03/2016 Elsevier Patient Education  2020 [employer].

## 2019-05-05 ENCOUNTER — Other Ambulatory Visit: Payer: Self-pay

## 2019-05-05 ENCOUNTER — Emergency Department (HOSPITAL_COMMUNITY)
Admission: EM | Admit: 2019-05-05 | Discharge: 2019-05-06 | Disposition: A | Discharge status: Home / Self Care | Payer: Medicare Other | Attending: Emergency Medicine | Admitting: Emergency Medicine

## 2019-05-05 ENCOUNTER — Encounter (HOSPITAL_COMMUNITY): Payer: Self-pay

## 2019-05-05 ENCOUNTER — Emergency Department (HOSPITAL_COMMUNITY)
Admission: EM | Admit: 2019-05-05 | Discharge: 2019-05-05 | Discharge status: Against Medical Advice | Payer: Medicare Other

## 2019-05-05 DIAGNOSIS — G44309 Post-traumatic headache, unspecified, not intractable: Secondary | ICD-10-CM | POA: Diagnosis not present

## 2019-05-05 DIAGNOSIS — R419 Unspecified symptoms and signs involving cognitive functions and awareness: Secondary | ICD-10-CM | POA: Diagnosis present

## 2019-05-05 DIAGNOSIS — R112 Nausea with vomiting, unspecified: Secondary | ICD-10-CM | POA: Diagnosis not present

## 2019-05-05 DIAGNOSIS — Z79899 Other long term (current) drug therapy: Secondary | ICD-10-CM | POA: Diagnosis not present

## 2019-05-05 MED ORDER — SODIUM CHLORIDE 0.9 % IV BOLUS
1000.0000 mL | Freq: Once | INTRAVENOUS | Status: AC
  Administered 2019-05-06: 1000 mL via INTRAVENOUS

## 2019-05-05 MED ORDER — PANTOPRAZOLE SODIUM 40 MG IV SOLR
40.0000 mg | Freq: Once | INTRAVENOUS | Status: AC
  Administered 2019-05-06: 01:00:00 40 mg via INTRAVENOUS
  Filled 2019-05-05: qty 40

## 2019-05-05 MED ORDER — DEXAMETHASONE SODIUM PHOSPHATE 10 MG/ML IJ SOLN
10.0000 mg | Freq: Once | INTRAMUSCULAR | Status: AC
  Administered 2019-05-06: 01:00:00 10 mg via INTRAVENOUS
  Filled 2019-05-05: qty 1

## 2019-05-05 MED ORDER — METOCLOPRAMIDE HCL 5 MG/ML IJ SOLN
10.0000 mg | Freq: Once | INTRAMUSCULAR | Status: AC
  Administered 2019-05-06: 10 mg via INTRAVENOUS
  Filled 2019-05-05: qty 2

## 2019-05-05 MED ORDER — DIPHENHYDRAMINE HCL 50 MG/ML IJ SOLN
25.0000 mg | Freq: Once | INTRAMUSCULAR | Status: AC
  Administered 2019-05-06: 25 mg via INTRAVENOUS
  Filled 2019-05-05: qty 1

## 2019-05-05 NOTE — ED Triage Notes (Signed)
Pt BIB [facility]. He reports intermittent migraines since July. He states that this migraine started on Sunday. States that he was at [facility] earlier today, but left d/t wait time. No neuro deficits. L eye is baseline per pt.
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
## 2019-05-06 ENCOUNTER — Emergency Department (HOSPITAL_COMMUNITY): Payer: Medicare Other

## 2019-05-06 DIAGNOSIS — G44309 Post-traumatic headache, unspecified, not intractable: Secondary | ICD-10-CM | POA: Diagnosis not present

## 2019-05-06 MED ORDER — KETOROLAC TROMETHAMINE 15 MG/ML IJ SOLN
15.0000 mg | Freq: Once | INTRAMUSCULAR | Status: AC
  Administered 2019-05-06: 15 mg via INTRAVENOUS
  Filled 2019-05-06: qty 1

## 2019-05-06 MED ORDER — OXYCODONE-ACETAMINOPHEN 5-325 MG PO TABS: 1.0000 | ORAL_TABLET | Freq: Four times a day (QID) | ORAL | 0 refills | Status: AC | PRN

## 2019-05-06 MED ORDER — HYDROMORPHONE HCL 1 MG/ML IJ SOLN
1.0000 mg | Freq: Once | INTRAMUSCULAR | Status: AC
  Administered 2019-05-06: 1 mg via INTRAVENOUS
  Filled 2019-05-06: qty 1

## 2019-05-06 MED ORDER — VALPROATE SODIUM 500 MG/5ML IV SOLN
500.0000 mg | Freq: Once | INTRAVENOUS | Status: AC
  Administered 2019-05-06: 500 mg via INTRAVENOUS
  Filled 2019-05-06: qty 5

## 2019-05-06 NOTE — ED Notes (Signed)
Patient transported to CT 

## 2019-05-06 NOTE — ED Notes (Signed)
MD advised to run Depacon as fast as pharmacy permits. Called pharmacy to verify how fast to run Dep[place]. Misty in Pharmacy advised to run over 15 minutes

## 2019-05-12 ENCOUNTER — Emergency Department (HOSPITAL_COMMUNITY)
Admission: EM | Admit: 2019-05-12 | Discharge: 2019-05-12 | Disposition: A | Discharge status: Home / Self Care | Payer: Medicare Other | Attending: Emergency Medicine | Admitting: Emergency Medicine

## 2019-05-12 ENCOUNTER — Encounter (HOSPITAL_COMMUNITY): Payer: Self-pay

## 2019-05-12 ENCOUNTER — Other Ambulatory Visit: Payer: Self-pay

## 2019-05-12 DIAGNOSIS — G43809 Other migraine, not intractable, without status migrainosus: Secondary | ICD-10-CM | POA: Diagnosis not present

## 2019-05-12 DIAGNOSIS — R519 Headache, unspecified: Secondary | ICD-10-CM | POA: Diagnosis present

## 2019-05-12 DIAGNOSIS — Z79899 Other long term (current) drug therapy: Secondary | ICD-10-CM | POA: Insufficient documentation

## 2019-05-12 MED ORDER — DIPHENHYDRAMINE HCL 50 MG/ML IJ SOLN
12.5000 mg | Freq: Once | INTRAMUSCULAR | Status: AC
  Administered 2019-05-12: 10:00:00 12.5 mg via INTRAVENOUS
  Filled 2019-05-12: qty 1

## 2019-05-12 MED ORDER — PROCHLORPERAZINE EDISYLATE 10 MG/2ML IJ SOLN
10.0000 mg | Freq: Once | INTRAMUSCULAR | Status: AC
  Administered 2019-05-12: 10 mg via INTRAVENOUS
  Filled 2019-05-12: qty 2

## 2019-05-12 MED ORDER — SODIUM CHLORIDE 0.9 % IV BOLUS (SEPSIS)
1000.0000 mL | Freq: Once | INTRAVENOUS | Status: AC
  Administered 2019-05-12: 1000 mL via INTRAVENOUS

## 2019-05-12 MED ORDER — KETOROLAC TROMETHAMINE 30 MG/ML IJ SOLN
30.0000 mg | Freq: Once | INTRAMUSCULAR | Status: AC
  Administered 2019-05-12: 30 mg via INTRAVENOUS
  Filled 2019-05-12: qty 1

## 2019-05-12 MED ORDER — SODIUM CHLORIDE 0.9 % IV SOLN
1000.0000 mL | INTRAVENOUS | Status: DC
  Administered 2019-05-12: 1000 mL via INTRAVENOUS
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
## 2019-05-12 NOTE — Discharge Instructions (Addendum)
Continue your current medications, follow up with your neurologist as we discussed
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
## 2019-05-25 ENCOUNTER — Other Ambulatory Visit: Payer: Self-pay | Admitting: Nurse Practitioner

## 2019-05-25 ENCOUNTER — Other Ambulatory Visit: Payer: Self-pay | Admitting: Family Medicine

## 2019-05-25 DIAGNOSIS — G43011 Migraine without aura, intractable, with status migrainosus: Secondary | ICD-10-CM

## 2019-05-25 NOTE — Telephone Encounter (Signed)
Charlotte please advise, pt also request refill for ibuprofen 800 as well.   Pt stated he have not reach out to his neurologist yet--didn't not stated reason. Last ov with you for headache was 05/03/2019.   FYI--Dr. C is not in the office today.

## 2019-05-26 NOTE — Telephone Encounter (Signed)
Dr. Loletha Grayer please advise for ibuprofen 800 that the pt request.  Baldo Ash responsed to the request for Imitrex refill request

## 2019-05-26 NOTE — Telephone Encounter (Signed)
Pt needs f/u with neurology Dr. Krista Blue with [facility]. He was seen in the ER 2x for headache since last visit with our office on 05/03/19. No further refills since it does not appear meds are helping. He needs to make appt with neuro

## 2019-05-27 ENCOUNTER — Telehealth: Payer: Self-pay

## 2019-05-27 NOTE — Telephone Encounter (Signed)
Attempted to reach patient. No answer. Vm left to call back  

## 2019-05-27 NOTE — Telephone Encounter (Signed)
Spoke with pt and he states he has been dating a girl for 2-3 months and she just informed him that she has herpes. She tried to inform pt that she was not having any flare ups but just thought he should know. Pt was concerned and he had a lot of questions like how long does it take for him to contract it and if he should get other test done. Pt agreed to an appointment. VV was scheduled for tomorrow.

## 2019-05-27 NOTE — Telephone Encounter (Signed)
Copied from [place] [id number]. Topic: General - Other >> May 27, 2019 12:05 PM Celene Kras wrote: Reason for CRM: Pt called stating he could not give me more information and is requesting to speak with PCP. Please advise.

## 2019-05-28 ENCOUNTER — Ambulatory Visit (INDEPENDENT_AMBULATORY_CARE_PROVIDER_SITE_OTHER): Payer: Medicare Other | Admitting: Family Medicine

## 2019-05-28 ENCOUNTER — Encounter: Payer: Self-pay | Admitting: Family Medicine

## 2019-05-28 ENCOUNTER — Ambulatory Visit: Payer: Medicare Other | Admitting: Family Medicine

## 2019-05-28 ENCOUNTER — Other Ambulatory Visit: Payer: Self-pay

## 2019-05-28 VITALS — Ht 71.0 in | Wt 235.0 lb

## 2019-05-28 DIAGNOSIS — Z20828 Contact with and (suspected) exposure to other viral communicable diseases: Secondary | ICD-10-CM | POA: Diagnosis not present

## 2019-05-28 NOTE — Patient Instructions (Signed)
Genital Herpes °Genital herpes is a common sexually transmitted infection (STI) that is caused by a virus. The virus spreads from person to person through sexual contact. Infection can cause itching, blisters, and sores around the genitals or rectum. Symptoms may last several days and then go away This is called an outbreak. However, the virus remains in your body, so you may have more outbreaks in the future. The time between outbreaks varies and can be months or years. °Genital herpes affects men and women. It is particularly concerning for pregnant women because the virus can be passed to the baby during delivery and can cause serious problems. Genital herpes is also a concern for people who have a weak disease-fighting (immune) system. °What are the causes? °This condition is caused by the herpes simplex virus (HSV) type 1 or type 2. The virus may spread through: °· Sexual contact with an infected person, including vaginal, anal, and oral sex. °· Contact with fluid from a herpes sore. °· The skin. This means that you can get herpes from an infected partner even if he or she does not have a visible sore or does not know that he or she is infected. °What increases the risk? °You are more likely to develop this condition if: °· You have sex with many partners. °· You do not use latex condoms during sex. °What are the signs or symptoms? °Most people do not have symptoms (asymptomatic) or have mild symptoms that may be mistaken for other skin problems. Symptoms may include: °· Small red bumps near the genitals, rectum, or mouth. These bumps turn into blisters and then turn into sores. °· Flu-like symptoms, including: °? Fever. °? Body aches. °? Swollen lymph nodes. °? Headache. °· Painful urination. °· Pain and itching in the genital area or rectal area. °· Vaginal discharge. °· Tingling or shooting pain in the legs and buttocks. °Generally, symptoms are more severe and last longer during the first (primary)  outbreak. Flu-like symptoms are also more common during the primary outbreak. °How is this diagnosed? °Genital herpes may be diagnosed based on: °· A physical exam. °· Your medical history. °· Blood tests. °· A test of a fluid sample (culture) from an open sore. °How is this treated? °There is no cure for this condition, but treatment with antiviral medicines that are taken by mouth (orally) can do the following: °· Speed up healing and relieve symptoms. °· Help to reduce the spread of the virus to sexual partners. °· Limit the chance of future outbreaks, or make future outbreaks shorter. °· Lessen symptoms of future outbreaks. °Your health care provider may also recommend pain relief medicines, such as aspirin or ibuprofen. °Follow these instructions at home: °Sexual activity °· Do not have sexual contact during active outbreaks. °· Practice safe sex. Latex condoms and male condoms may help prevent the spread of the herpes virus. °General instructions °· Keep the affected areas dry and clean. °· Take over-the-counter and prescription medicines only as told by your health care provider. °· Avoid rubbing or touching blisters and sores. If you do touch blisters or sores: °? Wash your hands thoroughly with soap and water. °? Do not touch your eyes afterward. °· To help relieve pain or itching, you may take the following actions as directed by your health care provider: °? Apply a cold, wet cloth (cold compress) to affected areas 4-6 times a day. °? Apply a substance that protects your skin and reduces bleeding (astringent). °? Apply a gel that   helps relieve pain around sores (lidocaine gel). °? Take a warm, shallow bath that cleans the genital area (sitz bath). °· Keep all follow-up visits as told by your health care provider. This is important. °How is this prevented? °· Use condoms. Although anyone can get genital herpes during sexual contact, even with the use of a condom, a condom can provide some  protection. °· Avoid having multiple sexual partners. °· Talk with your sexual partner about any symptoms either of you may have. Also, talk with your partner about any history of STIs. °· Get tested for STIs before you have sex. Ask your partner to do the same. °· Do not have sexual contact if you have symptoms of genital herpes. °Contact a health care provider if: °· Your symptoms are not improving with medicine. °· Your symptoms return. °· You have new symptoms. °· You have a fever. °· You have abdominal pain. °· You have redness, swelling, or pain in your eye. °· You notice new sores on other parts of your body. °· You are a woman and experience bleeding between menstrual periods. °· You have had herpes and you become pregnant or plan to become pregnant. °Summary °· Genital herpes is a common sexually transmitted infection (STI) that is caused by the herpes simplex virus (HSV) type 1 or type 2. °· These viruses are most often spread through sexual contact with an infected person. °· You are more likely to develop this condition if you have sex with many partners or you have unprotected sex. °· Most people do not have symptoms (asymptomatic) or have mild symptoms that may be mistaken for other skin problems. Symptoms occur as outbreaks that may happen months or years apart. °· There is no cure for this condition, but treatment with oral antiviral medicines can reduce symptoms, reduce the chance of spreading the virus to a partner, prevent future outbreaks, or shorten future outbreaks. °This information is not intended to replace advice given to you by your health care provider. Make sure you discuss any questions you have with your health care provider. °Document Released: 06/07/2000 Document Revised: 12/15/2017 Document Reviewed: 05/10/2016 °Elsevier Patient Education © 2020 [employer] Inc[employer] ° °

## 2019-05-28 NOTE — Progress Notes (Signed)
Virtual Visit via Video Note  I connected with Kyle Bradley on 05/28/19 at  3:00 PM EST by a video enabled telemedicine application and verified that I am speaking with the correct person using two identifiers. Location patient: home Location provider: work  Persons participating in the virtual visit: patient, provider  I discussed the limitations of evaluation and management by telemedicine and the availability of in person appointments. The patient expressed understanding and agreed to proceed.  Chief Complaint  Patient presents with  . Acute Visit    STD concerns     HPI: Kyle Bradley is a 33 y.o. male  Pt states he has been in a sexual relationship since 01/2019 with a male who has genital herpes. She did disclose this to him before any sexual contact, but pt states she told him that since she was/is not having outbreaks, that he cannot contact HSV from her. Pt used a condom but not consistently. He is not and has not had any symptoms. He is concerned now because he read online the HSV can be transmitted to sexual partners even if a individual with HSV is not having an outbreak at the time of sexual contact.   Past Medical History:  Diagnosis Date  . Headache   . Legally blind   . Sleep apnea     Past Surgical History:  Procedure Laterality Date  . EYE SURGERY    . TONSILLECTOMY      Family History  Problem Relation Age of Onset  . Hyperthyroidism Mother   . Heart failure Father     Social History   Tobacco Use  . Smoking status: Never Smoker  . Smokeless tobacco: Never Used  Substance Use Topics  . Alcohol use: Yes    Comment: occ  . Drug use: No     Current Outpatient Medications:  .  butalbital-acetaminophen-caffeine (FIORICET) 50-325-40 MG tablet, Take 1 tablet by mouth every 4 (four) hours as needed for headache. , Disp: , Rfl:  .  ibuprofen (ADVIL) 800 MG tablet, TAKE 1 TABLET BY MOUTH EVERY 8 HOURS AS NEEDED, Disp: 60 tablet, Rfl: 1 .   nortriptyline (PAMELOR) 25 MG capsule, TAKE 2 CAPSULES (50 MG TOTAL) BY MOUTH AT BEDTIME., Disp: 180 capsule, Rfl: 4 .  ondansetron (ZOFRAN ODT) 4 MG disintegrating tablet, Take 1 tablet (4 mg total) by mouth every 8 (eight) hours as needed., Disp: 20 tablet, Rfl: 6 .  oxyCODONE-acetaminophen (PERCOCET) 5-325 MG tablet, Take 1-2 tablets by mouth every 6 (six) hours as needed (for severe headache)., Disp: 8 tablet, Rfl: 0 .  propranolol (INDERAL) 20 MG tablet, Take 1 tablet (20 mg total) by mouth 2 (two) times daily., Disp: 60 tablet, Rfl: 11 .  SUMAtriptan (IMITREX) 50 MG tablet, Take 1 tablet (50 mg total) by mouth every 2 (two) hours as needed for migraine. May repeat in 2 hours if headache persists or recurs. Do not take more than 100mg  in 24hrs, Disp: 6 tablet, Rfl: 0 .  tiZANidine (ZANAFLEX) 4 MG tablet, Take 4 mg by mouth every 6 (six) hours as needed for muscle spasms., Disp: , Rfl:  .  topiramate (TOPAMAX) 50 MG tablet, TAKE 1 TABLET BY MOUTH PM X 1 WEEK THEN 1 TABLET BY MOUTH TWICE A DAY, Disp: , Rfl:   Allergies  Allergen Reactions  . Anesthetics, Amide Other (See Comments)    Pt has sleep apnea  . Morphine And Related Nausea Only      ROS: See pertinent positives and  negatives per HPI.   EXAM:  VITALS per patient if applicable: Ht 5\' 11"  (1.803 m)   Wt 235 lb (106.6 kg)   BMI 32.78 kg/m    GENERAL: alert, oriented, in no acute distress  HEENT: atraumatic, conjunctiva clear, no obvious abnormalities on inspection of external nose and ears  NECK: normal movements of the head and neck  LUNGS: on inspection no signs of respiratory distress, breathing rate appears normal, no obvious gross SOB, gasping or wheezing, no conversational dyspnea  CV: no obvious cyanosis   ASSESSMENT AND PLAN:  1. Exposure to herpes simplex virus (HSV) - discussed with pt that HSV is spread through sexual contact and can be spread regardless of an HSV-infected person being symptomatic or  not - discussed importance of safe sexual practices and consistent condom use - discussed signs and symptoms of HSV outbreak  - discussed that HSV serology is not recommended to screen asymptomatic patients due to low specificity and high false positive rate, among other issues - recommended pt make appt for eval if/when he has any symptoms of HSV outbreak so PCR testing of the lesion can be done and definitive diagnosis made  I personally spent 25 min with the patient today and greater than 50% was spent in counseling, coordination of care, education    I discussed the assessment and treatment plan with the patient. The patient was provided an opportunity to ask questions and all were answered. The patient agreed with the plan and demonstrated an understanding of the instructions.   The patient was advised to call back or seek an in-person evaluation if the symptoms worsen or if the condition fails to improve as anticipated.   , DO

## 2019-07-29 ENCOUNTER — Ambulatory Visit (INDEPENDENT_AMBULATORY_CARE_PROVIDER_SITE_OTHER): Payer: Medicare Other | Admitting: Family Medicine

## 2019-07-29 ENCOUNTER — Other Ambulatory Visit: Payer: Self-pay

## 2019-07-29 ENCOUNTER — Encounter: Payer: Self-pay | Admitting: Family Medicine

## 2019-07-29 VITALS — Ht 71.0 in

## 2019-07-29 DIAGNOSIS — Z20822 Contact with and (suspected) exposure to covid-19: Secondary | ICD-10-CM

## 2019-07-29 MED ORDER — HYDROCOD POLST-CPM POLST ER 10-8 MG/5ML PO SUER: 5.0000 mL | Freq: Every evening | ORAL | 0 refills | Status: AC | PRN

## 2019-07-29 MED ORDER — BENZONATATE 100 MG PO CAPS: 100.0000 mg | ORAL_CAPSULE | Freq: Three times a day (TID) | ORAL | 0 refills | Status: AC | PRN

## 2019-07-29 NOTE — Progress Notes (Signed)
Virtual Visit via Video Note  I connected with Kyle Bradley on 07/29/19 at  1:30 PM EST by a video enabled telemedicine application and verified that I am speaking with the correct person using two identifiers. Location patient: home Location provider: home office Persons participating in the virtual visit: patient, provider  I discussed the limitations of evaluation and management by telemedicine and the availability of in person appointments. The patient expressed understanding and agreed to proceed.  Chief Complaint  Patient presents with  . Ear Pain    Pt c/o ear pain and body aches x 2days.  Both ears are in pain.     HPI: Kyle Bradley is a 34 y.o. male who complains of 2 day h/o cough, body aches, B/L ear pain.  No fever, chills. + headache. + mild runny and stuffy nose. No sore throat. Some chest discomfort with coughing. No SOB. Pt took Nyquil last night.   Past Medical History:  Diagnosis Date  . Headache   . Legally blind   . Sleep apnea     Past Surgical History:  Procedure Laterality Date  . EYE SURGERY    . TONSILLECTOMY      Family History  Problem Relation Age of Onset  . Hyperthyroidism Mother   . Heart failure Father     Social History   Tobacco Use  . Smoking status: Never Smoker  . Smokeless tobacco: Never Used  Substance Use Topics  . Alcohol use: Yes    Comment: occ  . Drug use: No     Current Outpatient Medications:  .  butalbital-acetaminophen-caffeine (FIORICET) 50-325-40 MG tablet, Take 1 tablet by mouth every 4 (four) hours as needed for headache. , Disp: , Rfl:  .  ibuprofen (ADVIL) 800 MG tablet, TAKE 1 TABLET BY MOUTH EVERY 8 HOURS AS NEEDED, Disp: 60 tablet, Rfl: 1 .  nortriptyline (PAMELOR) 25 MG capsule, TAKE 2 CAPSULES (50 MG TOTAL) BY MOUTH AT BEDTIME., Disp: 180 capsule, Rfl: 4 .  ondansetron (ZOFRAN ODT) 4 MG disintegrating tablet, Take 1 tablet (4 mg total) by mouth every 8 (eight) hours as needed., Disp: 20 tablet, Rfl:  6 .  propranolol (INDERAL) 20 MG tablet, Take 1 tablet (20 mg total) by mouth 2 (two) times daily., Disp: 60 tablet, Rfl: 11 .  SUMAtriptan (IMITREX) 50 MG tablet, Take 1 tablet (50 mg total) by mouth every 2 (two) hours as needed for migraine. May repeat in 2 hours if headache persists or recurs. Do not take more than 100mg  in 24hrs, Disp: 6 tablet, Rfl: 0 .  oxyCODONE-acetaminophen (PERCOCET) 5-325 MG tablet, Take 1-2 tablets by mouth every 6 (six) hours as needed (for severe headache). (Patient not taking: Reported on 07/29/2019), Disp: 8 tablet, Rfl: 0 .  tiZANidine (ZANAFLEX) 4 MG tablet, Take 4 mg by mouth every 6 (six) hours as needed for muscle spasms., Disp: , Rfl:  .  topiramate (TOPAMAX) 50 MG tablet, TAKE 1 TABLET BY MOUTH PM X 1 WEEK THEN 1 TABLET BY MOUTH TWICE A DAY, Disp: , Rfl:   Allergies  Allergen Reactions  . Anesthetics, Amide Other (See Comments)    Pt has sleep apnea  . Morphine And Related Nausea Only      ROS: See pertinent positives and negatives per HPI.   EXAM:  VITALS per patient if applicable:  GENERAL: alert, oriented, appears well and in no acute distress  HEENT: atraumatic, conjunctiva clear, no obvious abnormalities on inspection of external nose and ears  NECK:  normal movements of the head and neck  LUNGS: on inspection no signs of respiratory distress, breathing rate appears normal, no obvious gross SOB, gasping or wheezing, no conversational dyspnea  CV: no obvious cyanosis  MS: moves all visible extremities without noticeable abnormality  PSYCH/NEURO: pleasant and cooperative, speech and thought processing grossly intact   ASSESSMENT AND PLAN: 1. Suspected COVID-19 virus infection - pt with symptoms concerning for covid, symptom onset <48hrs ago - recommended pt be tested for covid and self-quarantine until test resulted - recommended pt get tested on Monday (vs tomorrow Fri 2/5) so less change of false positive. Gave pt contact info to  schedule testing appt at [facility] - supportive care to include increased water intake, flonase, mucinex BID Rx: - tussionex 78mL qHS - tessalon 100mg  cap TID PRN - f/u if symptoms worsen Discussed plan and reviewed medications with patient, including risks, benefits, and potential side effects. Pt expressed understand. All questions answered.     I discussed the assessment and treatment plan with the patient. The patient was provided an opportunity to ask questions and all were answered. The patient agreed with the plan and demonstrated an understanding of the instructions.   The patient was advised to call back or seek an in-person evaluation if the symptoms worsen or if the condition fails to improve as anticipated.   , DO

## 2019-07-30 ENCOUNTER — Ambulatory Visit: Payer: Medicare Other | Attending: Internal Medicine

## 2019-07-30 DIAGNOSIS — Z20822 Contact with and (suspected) exposure to covid-19: Secondary | ICD-10-CM

## 2019-08-01 ENCOUNTER — Telehealth: Payer: Self-pay | Admitting: Family Medicine

## 2019-08-01 LAB — NOVEL CORONAVIRUS, NAA: SARS-CoV-2, NAA: NOT DETECTED

## 2019-08-01 NOTE — Telephone Encounter (Signed)
Negative COVID results given. Patient results "NOT Detected." Caller expressed understanding. ° °

## 2019-10-19 ENCOUNTER — Emergency Department (HOSPITAL_COMMUNITY)
Admission: EM | Admit: 2019-10-19 | Discharge: 2019-10-19 | Disposition: A | Discharge status: Home / Self Care | Payer: Medicare Other | Attending: Emergency Medicine | Admitting: Emergency Medicine

## 2019-10-19 ENCOUNTER — Other Ambulatory Visit: Payer: Self-pay

## 2019-10-19 ENCOUNTER — Encounter (HOSPITAL_COMMUNITY): Payer: Self-pay | Admitting: Obstetrics and Gynecology

## 2019-10-19 DIAGNOSIS — M79602 Pain in left arm: Secondary | ICD-10-CM | POA: Insufficient documentation

## 2019-10-19 DIAGNOSIS — Z79899 Other long term (current) drug therapy: Secondary | ICD-10-CM | POA: Diagnosis not present

## 2019-10-19 DIAGNOSIS — R2232 Localized swelling, mass and lump, left upper limb: Secondary | ICD-10-CM | POA: Diagnosis present

## 2019-10-19 LAB — URINALYSIS, ROUTINE W REFLEX MICROSCOPIC
Bilirubin Urine: NEGATIVE
Glucose, UA: NEGATIVE mg/dL
Hgb urine dipstick: NEGATIVE
Ketones, ur: NEGATIVE mg/dL
Leukocytes,Ua: NEGATIVE
Nitrite: NEGATIVE
Protein, ur: NEGATIVE mg/dL
Specific Gravity, Urine: 1.025 (ref 1.005–1.030)
pH: 5 (ref 5.0–8.0)

## 2019-10-19 MED ORDER — METHOCARBAMOL 500 MG PO TABS: 500.0000 mg | ORAL_TABLET | Freq: Two times a day (BID) | ORAL | 0 refills | Status: AC

## 2019-10-19 NOTE — Discharge Instructions (Signed)
You can take Tylenol or Ibuprofen as directed for pain. You can alternate Tylenol and Ibuprofen every 4 hours. If you take Tylenol at 1pm, then you can take Ibuprofen at 5pm. Then you can take Tylenol again at 9pm.   Take Robaxin as prescribed. This medication will make you drowsy so do not drive or drink alcohol when taking it.  Follow the RICE (Rest, Ice, Compression, Elevation) protocol as directed.   Follow-up with referred orthopedic doctor.  Return the emergency department for any worsening pain, redness or swelling of the arm, fevers, numbness/weakness or any other worsening or concerning symptoms.
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
## 2019-10-19 NOTE — ED Triage Notes (Signed)
Patient reports he has pain in his hands and arms after working. Patient reports his hands lock up on him at times after he lifts heavy things

## 2019-12-08 ENCOUNTER — Emergency Department (HOSPITAL_COMMUNITY)
Admission: EM | Admit: 2019-12-08 | Discharge: 2019-12-09 | Disposition: A | Discharge status: Home / Self Care | Payer: Medicare Other | Attending: Emergency Medicine | Admitting: Emergency Medicine

## 2019-12-08 ENCOUNTER — Other Ambulatory Visit: Payer: Self-pay

## 2019-12-08 ENCOUNTER — Encounter (HOSPITAL_COMMUNITY): Payer: Self-pay

## 2019-12-08 ENCOUNTER — Emergency Department (HOSPITAL_COMMUNITY): Payer: Medicare Other

## 2019-12-08 DIAGNOSIS — M79652 Pain in left thigh: Secondary | ICD-10-CM | POA: Diagnosis not present

## 2019-12-08 DIAGNOSIS — R102 Pelvic and perineal pain: Secondary | ICD-10-CM | POA: Diagnosis not present

## 2019-12-08 DIAGNOSIS — R109 Unspecified abdominal pain: Secondary | ICD-10-CM

## 2019-12-08 DIAGNOSIS — R1032 Left lower quadrant pain: Secondary | ICD-10-CM | POA: Diagnosis not present

## 2019-12-08 DIAGNOSIS — R110 Nausea: Secondary | ICD-10-CM | POA: Insufficient documentation

## 2019-12-08 MED ORDER — KETOROLAC TROMETHAMINE 60 MG/2ML IM SOLN
60.0000 mg | Freq: Once | INTRAMUSCULAR | Status: AC
  Administered 2019-12-08: 60 mg via INTRAMUSCULAR
  Filled 2019-12-08: qty 2
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
## 2019-12-08 NOTE — ED Triage Notes (Signed)
Arrived POV from home. Patient reports left pelvic pain that started about 2 days ago after lifting something heavy at work. Patient reports pain radiates into left groin and left leg. Patient states he can feel a knot in lower abdomen where pain originates

## 2019-12-09 LAB — URINALYSIS, ROUTINE W REFLEX MICROSCOPIC
Bacteria, UA: NONE SEEN
Bilirubin Urine: NEGATIVE
Glucose, UA: NEGATIVE mg/dL
Hgb urine dipstick: NEGATIVE
Ketones, ur: 5 mg/dL — AB
Leukocytes,Ua: NEGATIVE
Nitrite: NEGATIVE
Protein, ur: 30 mg/dL — AB
Specific Gravity, Urine: 1.03 (ref 1.005–1.030)
pH: 5 (ref 5.0–8.0)

## 2019-12-09 MED ORDER — NAPROXEN 500 MG PO TABS: 500.0000 mg | ORAL_TABLET | Freq: Two times a day (BID) | ORAL | 0 refills | Status: AC

## 2019-12-09 MED ORDER — CYCLOBENZAPRINE HCL 10 MG PO TABS: 10.0000 mg | ORAL_TABLET | Freq: Three times a day (TID) | ORAL | 0 refills | Status: AC | PRN

## 2019-12-09 NOTE — Discharge Instructions (Signed)
Begin taking naproxen as prescribed.  Take Flexeril as needed for pain not relieved with naproxen.  Follow-up with your primary doctor and return to the ER if symptoms significantly worsen or change.
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
